# Patient Record
Sex: Female | Born: 1970 | Race: Black or African American | Hispanic: No | Marital: Married | Smoking: Former smoker
Health system: Southern US, Community
[De-identification: ages and names within clinical notes are randomized; demographics above are authoritative.]

---

## 2003-05-27 ENCOUNTER — Emergency Department (HOSPITAL_COMMUNITY): Admission: EM | Admit: 2003-05-27 | Discharge: 2003-05-27 | Payer: Self-pay | Admitting: Emergency Medicine

## 2009-03-13 ENCOUNTER — Ambulatory Visit: Payer: Self-pay | Admitting: Diagnostic Radiology

## 2009-03-13 ENCOUNTER — Emergency Department (HOSPITAL_BASED_OUTPATIENT_CLINIC_OR_DEPARTMENT_OTHER): Admission: EM | Admit: 2009-03-13 | Discharge: 2009-03-13 | Payer: Self-pay | Admitting: Emergency Medicine
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
## 2010-09-30 LAB — BASIC METABOLIC PANEL
BUN: 8 mg/dL (ref 6–23)
CO2: 26 mEq/L (ref 19–32)
Calcium: 9.3 mg/dL (ref 8.4–10.5)
Chloride: 108 mEq/L (ref 96–112)
Creatinine, Ser: 0.8 mg/dL (ref 0.4–1.2)
GFR calc Af Amer: >60 mL/min (ref >60)
GFR calc non Af Amer: >60 mL/min (ref >60)
Glucose, Bld: 84 mg/dL (ref 70–99)
Potassium: 3.9 mEq/L (ref 3.5–5.1)
Sodium: 144 mEq/L (ref 135–145)

## 2010-09-30 LAB — DIFFERENTIAL
Band Neutrophils: 0 % (ref 0–10)
Basophils Absolute: 0 10*3/uL (ref 0.0–0.1)
Basophils Relative: 0 % (ref 0–1)
Blasts: 0 %
Eosinophils Absolute: 0.2 10*3/uL (ref 0.0–0.7)
Eosinophils Relative: 3 % (ref 0–5)
Lymphocytes Relative: 33 % (ref 12–46)
Lymphs Abs: 2.1 10*3/uL (ref 0.7–4.0)
Metamyelocytes Relative: 0 %
Monocytes Absolute: 0.3 10*3/uL (ref 0.1–1.0)
Monocytes Relative: 5 % (ref 3–12)
Myelocytes: 0 %
Neutro Abs: 3.7 10*3/uL (ref 1.7–7.7)
Neutrophils Relative %: 59 % (ref 43–77)
Promyelocytes Absolute: 0 %
nRBC: 0 /100 WBC

## 2010-09-30 LAB — CBC
HCT: 35.8 % — ABNORMAL LOW (ref 36.0–46.0)
Hemoglobin: 12.1 g/dL (ref 12.0–15.0)
MCHC: 33.7 g/dL (ref 30.0–36.0)
MCV: 79.8 fL (ref 78.0–100.0)
Platelets: 218 10*3/uL (ref 150–400)
RBC: 4.48 MIL/uL (ref 3.87–5.11)
RDW: 13.8 % (ref 11.5–15.5)
WBC: 6.3 10*3/uL (ref 4.0–10.5)

## 2014-06-21 ENCOUNTER — Emergency Department (HOSPITAL_BASED_OUTPATIENT_CLINIC_OR_DEPARTMENT_OTHER)
Admission: EM | Admit: 2014-06-21 | Discharge: 2014-06-22 | Disposition: A | Discharge status: Home / Self Care | Payer: 59 | Attending: Emergency Medicine | Admitting: Emergency Medicine

## 2014-06-21 ENCOUNTER — Encounter (HOSPITAL_BASED_OUTPATIENT_CLINIC_OR_DEPARTMENT_OTHER): Payer: Self-pay | Admitting: Emergency Medicine

## 2014-06-21 DIAGNOSIS — R079 Chest pain, unspecified: Secondary | ICD-10-CM | POA: Diagnosis not present

## 2014-06-21 DIAGNOSIS — R52 Pain, unspecified: Secondary | ICD-10-CM

## 2014-06-21 DIAGNOSIS — R0602 Shortness of breath: Secondary | ICD-10-CM | POA: Insufficient documentation

## 2014-06-21 DIAGNOSIS — Z3202 Encounter for pregnancy test, result negative: Secondary | ICD-10-CM | POA: Insufficient documentation

## 2014-06-21 DIAGNOSIS — R002 Palpitations: Secondary | ICD-10-CM | POA: Diagnosis not present

## 2014-06-21 DIAGNOSIS — Z87891 Personal history of nicotine dependence: Secondary | ICD-10-CM | POA: Diagnosis not present

## 2014-06-21 LAB — BASIC METABOLIC PANEL
Anion gap: 7 (ref 5–15)
BUN: 11 mg/dL (ref 6–23)
CO2: 25 mmol/L (ref 19–32)
Calcium: 8.7 mg/dL (ref 8.4–10.5)
Chloride: 106 mEq/L (ref 96–112)
Creatinine, Ser: 0.67 mg/dL (ref 0.50–1.10)
GFR calc Af Amer: >90 mL/min (ref >90)
GFR calc non Af Amer: >90 mL/min (ref >90)
Glucose, Bld: 89 mg/dL (ref 70–99)
Potassium: 3.7 mmol/L (ref 3.5–5.1)
Sodium: 138 mmol/L (ref 135–145)

## 2014-06-21 LAB — CBC WITH DIFFERENTIAL/PLATELET
Basophils Absolute: 0 10*3/uL (ref 0.0–0.1)
Basophils Relative: 0 % (ref 0–1)
Eosinophils Absolute: 0.1 10*3/uL (ref 0.0–0.7)
Eosinophils Relative: 3 % (ref 0–5)
HCT: 32.2 % — ABNORMAL LOW (ref 36.0–46.0)
Hemoglobin: 10.6 g/dL — ABNORMAL LOW (ref 12.0–15.0)
Lymphocytes Relative: 44 % (ref 12–46)
Lymphs Abs: 2.1 10*3/uL (ref 0.7–4.0)
MCH: 24.1 pg — ABNORMAL LOW (ref 26.0–34.0)
MCHC: 32.9 g/dL (ref 30.0–36.0)
MCV: 73.3 fL — ABNORMAL LOW (ref 78.0–100.0)
Monocytes Absolute: 0.4 10*3/uL (ref 0.1–1.0)
Monocytes Relative: 9 % (ref 3–12)
Neutro Abs: 2.1 10*3/uL (ref 1.7–7.7)
Neutrophils Relative %: 44 % (ref 43–77)
Platelets: 234 10*3/uL (ref 150–400)
RBC: 4.39 MIL/uL (ref 3.87–5.11)
RDW: 13.6 % (ref 11.5–15.5)
WBC: 4.7 10*3/uL (ref 4.0–10.5)

## 2014-06-21 LAB — D-DIMER, QUANTITATIVE: D-Dimer, Quant: <0.27 ug/mL-FEU (ref 0.00–0.48)

## 2014-06-21 LAB — TROPONIN I: Troponin I: <0.03 ng/mL (ref <0.031)
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
## 2014-06-21 NOTE — ED Notes (Signed)
Pt seen by this RT. Pt in no distress. HR 77, SAT 100% on room air. Pt c/o fast heart rate and SOB. Clear/Diminished breath sounds throughout.

## 2014-06-22 ENCOUNTER — Emergency Department (HOSPITAL_BASED_OUTPATIENT_CLINIC_OR_DEPARTMENT_OTHER): Payer: 59

## 2014-06-22 ENCOUNTER — Encounter (HOSPITAL_BASED_OUTPATIENT_CLINIC_OR_DEPARTMENT_OTHER): Payer: Self-pay | Admitting: Emergency Medicine

## 2014-06-22 LAB — PREGNANCY, URINE: Preg Test, Ur: NEGATIVE

## 2014-06-22 MED ORDER — KETOROLAC TROMETHAMINE 60 MG/2ML IM SOLN: 60.0000 mg | Freq: Once | INTRAMUSCULAR | Status: DC

## 2014-07-02 ENCOUNTER — Encounter: Payer: Self-pay | Admitting: Cardiology

## 2014-07-27 ENCOUNTER — Ambulatory Visit: Payer: Self-pay | Admitting: Cardiology

## 2014-07-28 ENCOUNTER — Ambulatory Visit: Payer: Self-pay | Admitting: Cardiology

## 2014-08-17 NOTE — Progress Notes (Signed)
erron

## 2014-08-18 ENCOUNTER — Encounter: Payer: Self-pay | Admitting: Cardiovascular Disease

## 2014-08-27 ENCOUNTER — Telehealth: Payer: Self-pay | Admitting: Cardiovascular Disease

## 2014-08-27 ENCOUNTER — Encounter: Payer: Self-pay | Admitting: Cardiovascular Disease

## 2014-08-27 NOTE — Telephone Encounter (Signed)
[facility].. [facility]

## 2015-09-07 IMAGING — CR DG CHEST 2V
2 series · 2 of 2 positions shown · non-contrast
Comparison: None.

CLINICAL DATA: Additional evaluation for acute chest pain.

EXAM:
CHEST  2 VIEW

[w chest pa]
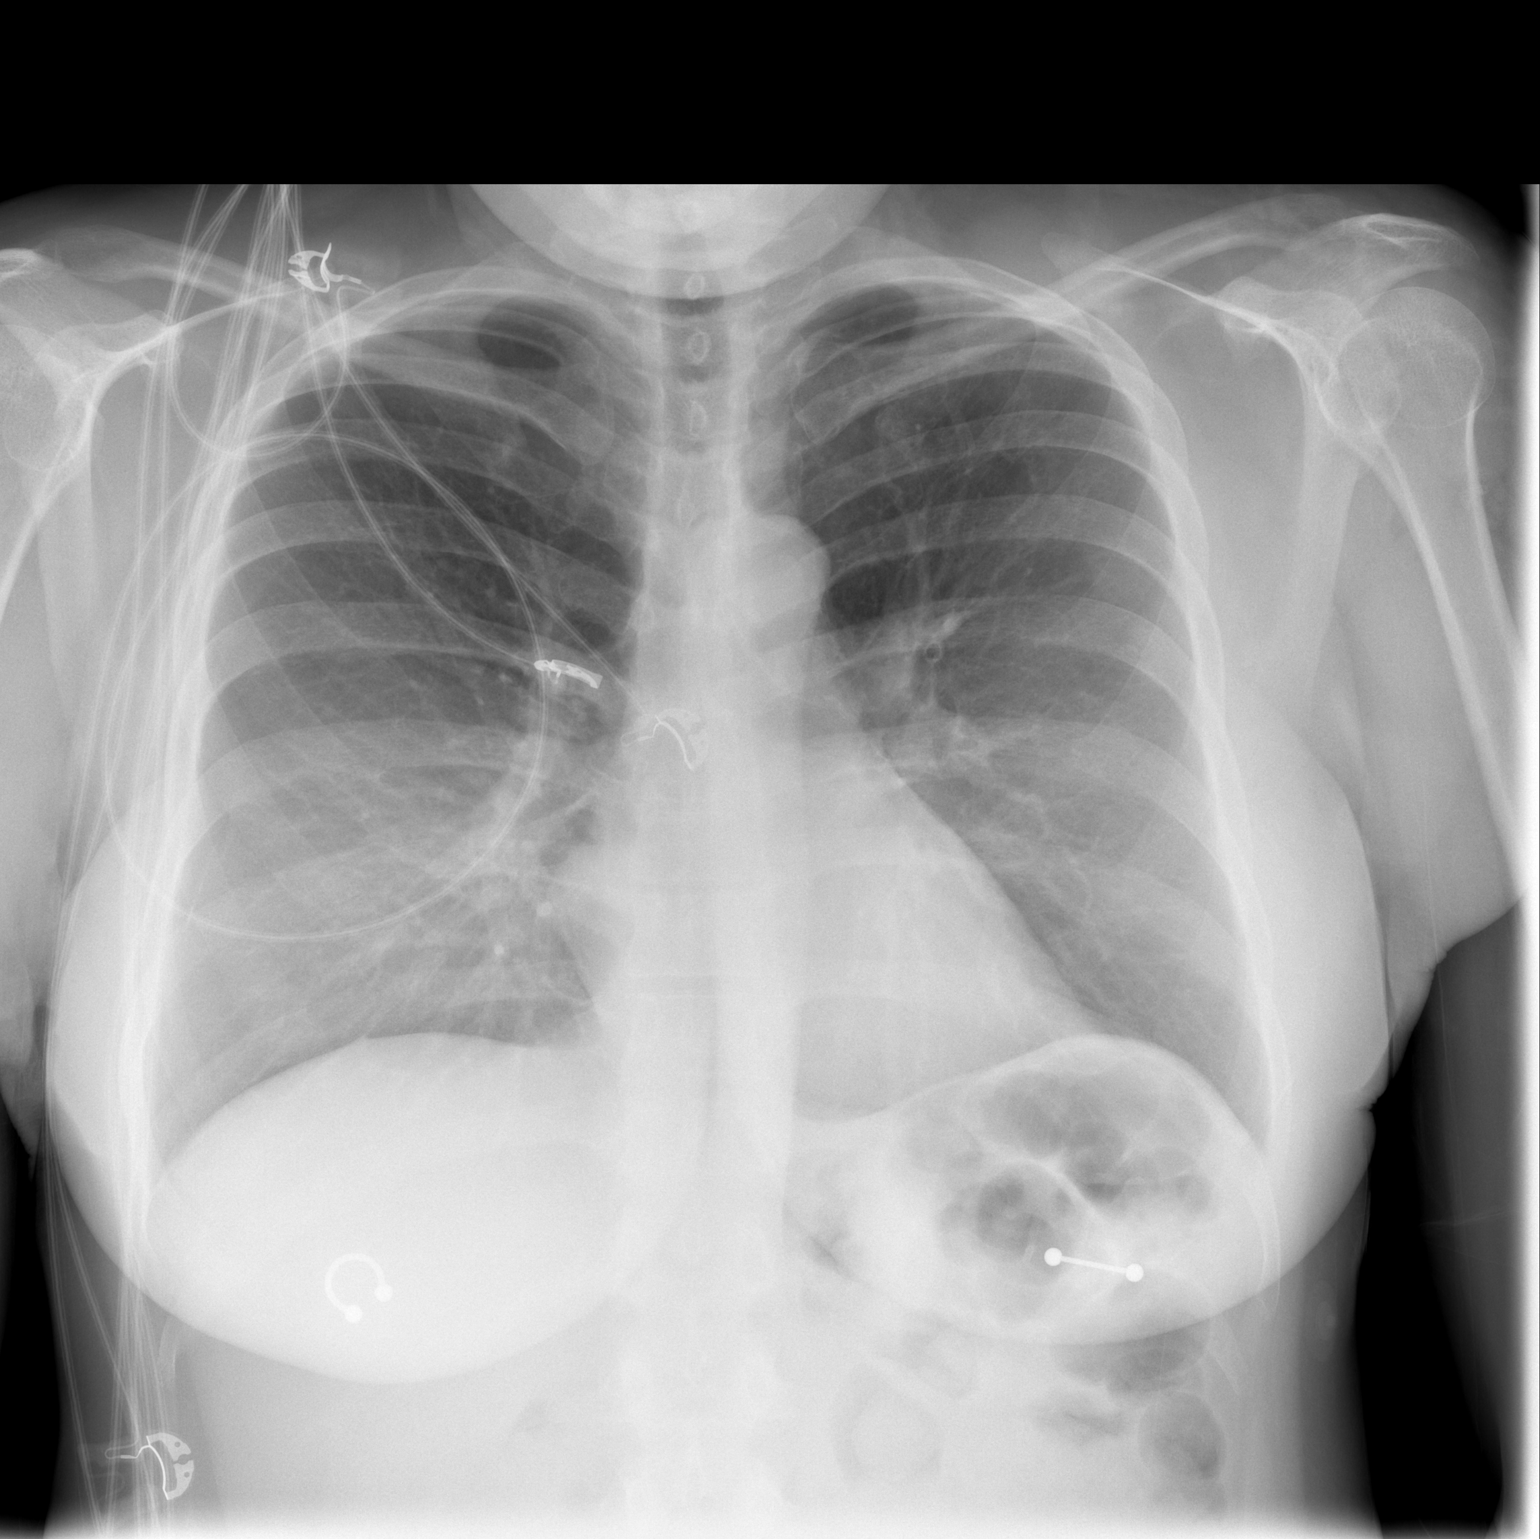

[w chest lat]
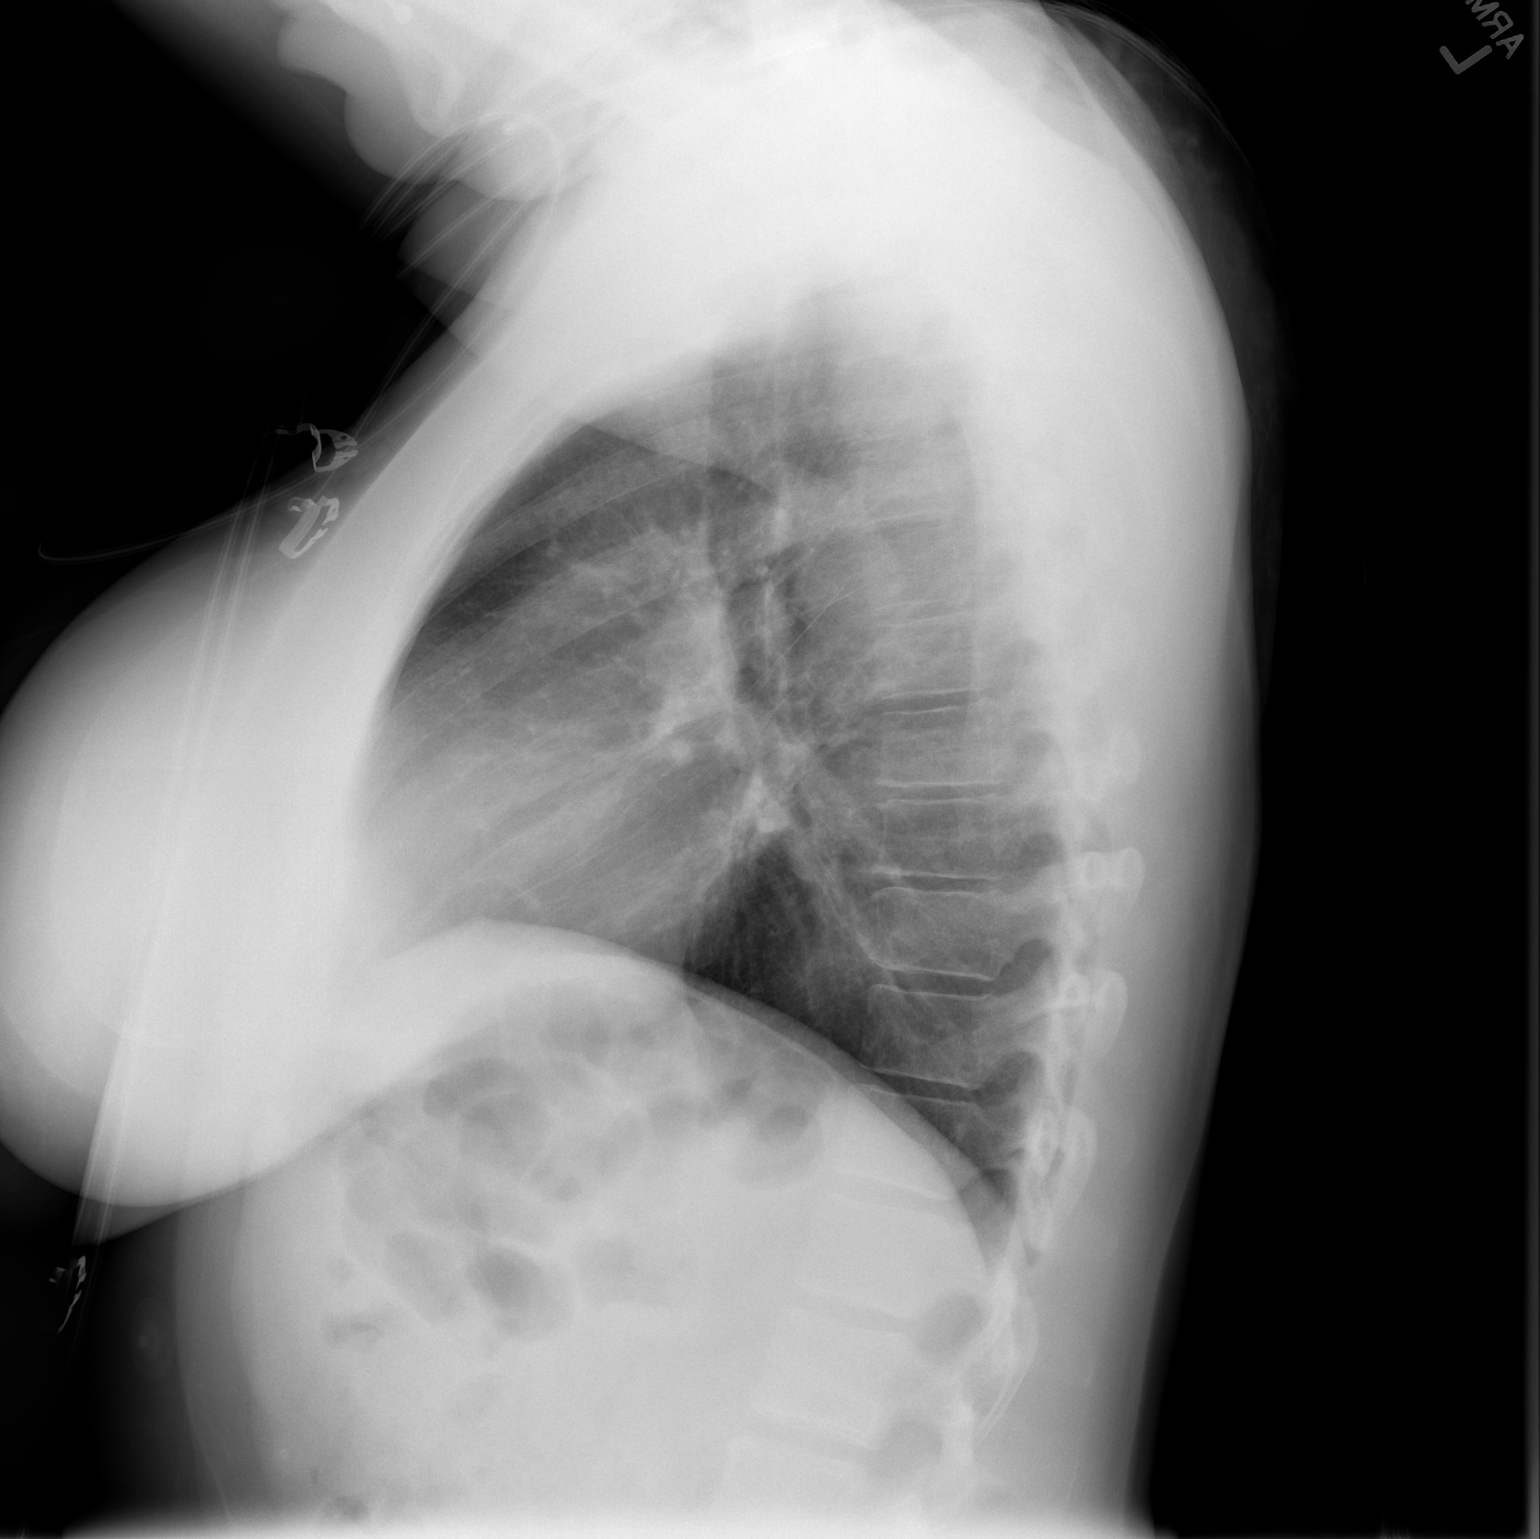

[2 of 2 positions shown; findings below may reference images not displayed]

FINDINGS: The cardiac and mediastinal silhouettes are within normal limits.

The lungs are normally inflated. No airspace consolidation, pleural
effusion, or pulmonary edema is identified. There is no
pneumothorax.

No acute osseous abnormality identified.
IMPRESSION: No active cardiopulmonary disease.

## 2021-07-12 ENCOUNTER — Other Ambulatory Visit (HOSPITAL_BASED_OUTPATIENT_CLINIC_OR_DEPARTMENT_OTHER): Payer: Self-pay

## 2022-11-28 ENCOUNTER — Other Ambulatory Visit: Payer: Self-pay
